# Patient Record
Sex: Female | Born: 1975 | Race: Black or African American | Hispanic: No | Marital: Married | Smoking: Never smoker
Health system: Southern US, Community
[De-identification: ages and names within clinical notes are randomized; demographics above are authoritative.]

## PROBLEM LIST (undated history)

## (undated) HISTORY — PX: BUNIONECTOMY: SHX129

---

## 2003-08-25 ENCOUNTER — Emergency Department (HOSPITAL_COMMUNITY): Admission: EM | Admit: 2003-08-25 | Discharge: 2003-08-26 | Payer: Self-pay | Admitting: Emergency Medicine

## 2005-02-05 ENCOUNTER — Other Ambulatory Visit: Admission: RE | Admit: 2005-02-05 | Discharge: 2005-02-05 | Payer: Self-pay | Admitting: *Deleted

## 2006-02-22 ENCOUNTER — Other Ambulatory Visit: Admission: RE | Admit: 2006-02-22 | Discharge: 2006-02-22 | Payer: Self-pay | Admitting: *Deleted

## 2006-04-12 ENCOUNTER — Emergency Department (HOSPITAL_COMMUNITY): Admission: EM | Admit: 2006-04-12 | Discharge: 2006-04-12 | Payer: Self-pay | Admitting: Emergency Medicine

## 2007-05-10 ENCOUNTER — Other Ambulatory Visit: Admission: RE | Admit: 2007-05-10 | Discharge: 2007-05-10 | Payer: Self-pay | Admitting: *Deleted

## 2008-06-21 ENCOUNTER — Other Ambulatory Visit: Admission: RE | Admit: 2008-06-21 | Discharge: 2008-06-21 | Payer: Self-pay | Admitting: Gynecology

## 2012-07-19 ENCOUNTER — Encounter (HOSPITAL_COMMUNITY): Payer: Self-pay | Admitting: Emergency Medicine

## 2012-07-19 ENCOUNTER — Emergency Department (INDEPENDENT_AMBULATORY_CARE_PROVIDER_SITE_OTHER)
Admission: EM | Admit: 2012-07-19 | Discharge: 2012-07-19 | Disposition: A | Discharge status: Home / Self Care | Payer: Self-pay | Source: Home / Self Care | Attending: Family Medicine | Admitting: Family Medicine

## 2012-07-19 DIAGNOSIS — J020 Streptococcal pharyngitis: Secondary | ICD-10-CM

## 2012-07-19 LAB — POCT RAPID STREP A: Streptococcus, Group A Screen (Direct): NEGATIVE

## 2012-07-19 MED ORDER — AMOXICILLIN 500 MG PO CAPS: 500.0000 mg | ORAL_CAPSULE | Freq: Three times a day (TID) | ORAL | Status: DC

## 2012-07-19 NOTE — ED Provider Notes (Signed)
History     CSN: [id number]  Arrival date & time 07/19/12  1207   First MD Initiated Contact with Patient 07/19/12 1213      Chief Complaint  Patient presents with  . Sore Throat    (Consider location/radiation/quality/duration/timing/severity/associated sxs/prior treatment) Patient is a 36 y.o. female presenting with pharyngitis. The history is provided by the patient.  Sore Throat This is a new problem. The current episode started more than 2 days ago. The problem has been gradually worsening. The symptoms are aggravated by swallowing.    History reviewed. No pertinent past medical history.  Past Surgical History  Procedure Date  . Bunionectomy     No family history on file.  History  Substance Use Topics  . Smoking status: Never Smoker   . Smokeless tobacco: Not on file  . Alcohol Use: No    OB History    Grav Para Term Preterm Abortions TAB SAB Ect Mult Living                  Review of Systems  Constitutional: Positive for fever, chills and appetite change.  HENT: Positive for sore throat.   Hematological: Positive for adenopathy.    Allergies  Review of patient's allergies indicates no known allergies.  Home Medications   Current Outpatient Rx  Name  Route  Sig  Dispense  Refill  . NAPROXEN SODIUM 220 MG PO TABS   Oral   Take 220 mg by mouth 2 (two) times daily with a meal.         . OSELTAMIVIR PHOSPHATE 75 MG PO CAPS   Oral   Take 75 mg by mouth.         . AMOXICILLIN 500 MG PO CAPS   Oral   Take 1 capsule (500 mg total) by mouth 3 (three) times daily.   30 capsule   0     BP 103/72  Pulse 89  Temp 99.9 F (37.7 C) (Oral)  Resp 20  SpO2 98%  LMP 06/29/2012  Physical Exam  Nursing note and vitals reviewed. Constitutional: She is oriented to person, place, and time. She appears well-developed and well-nourished.  HENT:  Head: Normocephalic.  Right Ear: External ear normal.  Left Ear: External ear normal.  Mouth/Throat:  Oropharyngeal exudate present.  Eyes: Conjunctivae normal are normal. Pupils are equal, round, and reactive to light.  Neck: Normal range of motion. Neck supple.  Lymphadenopathy:    She has cervical adenopathy.  Neurological: She is alert and oriented to person, place, and time.  Skin: Skin is warm and dry. No rash noted.    ED Course  Procedures (including critical care time)   Labs Reviewed  POCT RAPID STREP A ([facility] CARE ONLY)   No results found.   1. Strep sore throat       MDM          Linna Hoff, MD 07/19/12 1340

## 2012-07-19 NOTE — ED Notes (Signed)
Sore throat, fever, general aches, cough, and headache.  Onset November 1.

## 2013-05-08 LAB — LAB REPORT - SCANNED: Pap: NEGATIVE

## 2015-02-18 ENCOUNTER — Ambulatory Visit (INDEPENDENT_AMBULATORY_CARE_PROVIDER_SITE_OTHER): Payer: BLUE CROSS/BLUE SHIELD

## 2015-02-18 ENCOUNTER — Ambulatory Visit (INDEPENDENT_AMBULATORY_CARE_PROVIDER_SITE_OTHER): Payer: BLUE CROSS/BLUE SHIELD | Admitting: Podiatry

## 2015-02-18 ENCOUNTER — Encounter: Payer: Self-pay | Admitting: Podiatry

## 2015-02-18 VITALS — BP 117/76 | HR 76 | Resp 12

## 2015-02-18 DIAGNOSIS — M779 Enthesopathy, unspecified: Secondary | ICD-10-CM | POA: Diagnosis not present

## 2015-02-18 DIAGNOSIS — M2010 Hallux valgus (acquired), unspecified foot: Secondary | ICD-10-CM

## 2015-02-18 NOTE — Progress Notes (Signed)
   Subjective:    Patient ID: Heidi Mullins, female    DOB: 1976/03/21, 39 y.o.   MRN: [id number]  HPI  PT STATED HAVE BUNIONECTOMY DONE 15 YEARS AGO AND RT FOOT PAIN IS GETTING WORSE THAN LT FOOT FOR 1 YEAR. FEET ARE GETTING WORSE ESPECIALLY WHEN WEARING SHOES OR SITTING. TRIED NO TREATMENT  Review of Systems  Musculoskeletal: Positive for back pain.  Neurological: Positive for headaches.       Objective:   Physical Exam        Assessment & Plan:

## 2015-02-18 NOTE — Patient Instructions (Signed)
Pre-Operative Instructions  Congratulations, you have decided to take an important step to improving your quality of life.  You can be assured that the doctors of [facility] will be with you every step of the way.  1. Plan to be at the surgery center/hospital at least 1 (one) hour prior to your scheduled time unless otherwise directed by the surgical center/hospital staff.  You must have a responsible adult accompany you, remain during the surgery and drive you home.  Make sure you have directions to the surgical center/hospital and know how to get there on time. 2. For hospital based surgery you will need to obtain a history and physical form from your family physician within 1 month prior to the date of surgery- we will give you a form for you primary physician.  3. We make every effort to accommodate the date you request for surgery.  There are however, times where surgery dates or times have to be moved.  We will contact you as soon as possible if a change in schedule is required.   4. No Aspirin/Ibuprofen for one week before surgery.  If you are on aspirin, any non-steroidal anti-inflammatory medications (Mobic, Aleve, Ibuprofen) you should stop taking it 7 days prior to your surgery.  You make take Tylenol  For pain prior to surgery.  5. Medications- If you are taking daily heart and blood pressure medications, seizure, reflux, allergy, asthma, anxiety, pain or diabetes medications, make sure the surgery center/hospital is aware before the day of surgery so they may notify you which medications to take or avoid the day of surgery. 6. No food or drink after midnight the night before surgery unless directed otherwise by surgical center/hospital staff. 7. No alcoholic beverages 24 hours prior to surgery.  No smoking 24 hours prior to or 24 hours after surgery. 8. Wear loose pants or shorts- loose enough to fit over bandages, boots, and casts. 9. No slip on shoes, sneakers are best. 10. Bring  your boot with you to the surgery center/hospital.  Also bring crutches or a walker if your physician has prescribed it for you.  If you do not have this equipment, it will be provided for you after surgery. 11. If you have not been contracted by the surgery center/hospital by the day before your surgery, call to confirm the date and time of your surgery. 12. Leave-time from work may vary depending on the type of surgery you have.  Appropriate arrangements should be made prior to surgery with your employer. 13. Prescriptions will be provided immediately following surgery by your doctor.  Have these filled as soon as possible after surgery and take the medication as directed. 14. Remove nail polish on the operative foot. 15. Wash the night before surgery.  The night before surgery wash the foot and leg well with the antibacterial soap provided and water paying special attention to beneath the toenails and in between the toes.  Rinse thoroughly with water and dry well with a towel.  Perform this wash unless told not to do so by your physician.  Enclosed: 1 Ice pack (please put in freezer the night before surgery)   1 Hibiclens skin cleaner   Pre-op Instructions  If you have any questions regarding the instructions, do not hesitate to call our office.  [place]: [address] , [place] [address] [phone]  [place]: [address] [place], [place] [address] [phone]  [place]: [address]  [place], [place] [address] [phone]  Dr. Richard   Tuchman DPM, Dr. Norman Regal DPM Dr. Richard Sikora DPM, Dr. M. Todd Hyatt DPM, Dr. Kathryn Egerton DPM 

## 2015-02-18 NOTE — Progress Notes (Signed)
Subjective:     Patient ID: Heidi Mullins, female   DOB: Jul 06, 1976, 39 y.o.   MRN: [id number]  HPI patient states this bunion on my right foot has really been bothering me and I had it fixed approximately 18 years ago and it's making it hard for me to wear shoes. Also my arches can get tired and the left one is not as bad but I wanted it checked to   Review of Systems  All other systems reviewed and are negative.      Objective:   Physical Exam  Constitutional: She is oriented to person, place, and time.  Cardiovascular: Intact distal pulses.   Musculoskeletal: Normal range of motion.  Neurological: She is oriented to person, place, and time.  Skin: Skin is warm.  Nursing note and vitals reviewed.  neurovascular status intact muscle strength adequate with range of motion subtalar midtarsal joint within normal limits. Patient's found have hyperostosis with redness medial aspect first metatarsal head right that is painful when pressed and also patient is noted to have surgical scars first metatarsal right and left  in line with ones that we have done previously. [place] and also complains of pain in the mid arch area bilateral with depression of the arch noted bilateral. 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 Depression of the arch with tendinitis    Plan:      H&P and x-rays reviewed with patient. At this point I recommended pin removal and distal osteotomy right first metatarsal is a do believe the deformity is moderate and we can move over a little bit more and give her relief of symptoms I explained this is revisional surgery and is no guarantee and she   understands stands this. I then went ahead and I scanned for custom orthotics to reduce stress against her arches and when she returns we will go over  consent form for surgery scheduled in July

## 2015-03-11 ENCOUNTER — Ambulatory Visit (INDEPENDENT_AMBULATORY_CARE_PROVIDER_SITE_OTHER): Payer: BLUE CROSS/BLUE SHIELD | Admitting: Podiatry

## 2015-03-11 ENCOUNTER — Ambulatory Visit: Payer: BLUE CROSS/BLUE SHIELD | Admitting: Podiatry

## 2015-03-11 DIAGNOSIS — M2010 Hallux valgus (acquired), unspecified foot: Secondary | ICD-10-CM | POA: Diagnosis not present

## 2015-03-11 DIAGNOSIS — Z472 Encounter for removal of internal fixation device: Secondary | ICD-10-CM

## 2015-03-11 NOTE — Patient Instructions (Signed)
Pre-Operative Instructions  Congratulations, you have decided to take an important step to improving your quality of life.  You can be assured that the doctors of [facility] will be with you every step of the way.  1. Plan to be at the surgery center/hospital at least 1 (one) hour prior to your scheduled time unless otherwise directed by the surgical center/hospital staff.  You must have a responsible adult accompany you, remain during the surgery and drive you home.  Make sure you have directions to the surgical center/hospital and know how to get there on time. 2. For hospital based surgery you will need to obtain a history and physical form from your family physician within 1 month prior to the date of surgery- we will give you a form for you primary physician.  3. We make every effort to accommodate the date you request for surgery.  There are however, times where surgery dates or times have to be moved.  We will contact you as soon as possible if a change in schedule is required.   4. No Aspirin/Ibuprofen for one week before surgery.  If you are on aspirin, any non-steroidal anti-inflammatory medications (Mobic, Aleve, Ibuprofen) you should stop taking it 7 days prior to your surgery.  You make take Tylenol  For pain prior to surgery.  5. Medications- If you are taking daily heart and blood pressure medications, seizure, reflux, allergy, asthma, anxiety, pain or diabetes medications, make sure the surgery center/hospital is aware before the day of surgery so they may notify you which medications to take or avoid the day of surgery. 6. No food or drink after midnight the night before surgery unless directed otherwise by surgical center/hospital staff. 7. No alcoholic beverages 24 hours prior to surgery.  No smoking 24 hours prior to or 24 hours after surgery. 8. Wear loose pants or shorts- loose enough to fit over bandages, boots, and casts. 9. No slip on shoes, sneakers are best. 10. Bring  your boot with you to the surgery center/hospital.  Also bring crutches or a walker if your physician has prescribed it for you.  If you do not have this equipment, it will be provided for you after surgery. 11. If you have not been contracted by the surgery center/hospital by the day before your surgery, call to confirm the date and time of your surgery. 12. Leave-time from work may vary depending on the type of surgery you have.  Appropriate arrangements should be made prior to surgery with your employer. 13. Prescriptions will be provided immediately following surgery by your doctor.  Have these filled as soon as possible after surgery and take the medication as directed. 14. Remove nail polish on the operative foot. 15. Wash the night before surgery.  The night before surgery wash the foot and leg well with the antibacterial soap provided and water paying special attention to beneath the toenails and in between the toes.  Rinse thoroughly with water and dry well with a towel.  Perform this wash unless told not to do so by your physician.  Enclosed: 1 Ice pack (please put in freezer the night before surgery)   1 Hibiclens skin cleaner   Pre-op Instructions  If you have any questions regarding the instructions, do not hesitate to call our office.  [place]: [address] [place], [place] [address] [phone]  [place]: [address] [place], [place] [address] [phone]  [place]: [address]  [place], [place] [address] [phone]  Dr. Richard   Tuchman DPM, Dr. Norman Regal DPM Dr. Richard Sikora DPM, Dr. M. Todd Hyatt DPM, Dr. Kathryn Egerton DPM 

## 2015-03-13 ENCOUNTER — Telehealth: Payer: Self-pay | Admitting: *Deleted

## 2015-03-13 NOTE — Progress Notes (Signed)
Subjective:     Patient ID: Heidi Mullins, female   DOB: [birth year], 39 y.o.   MRN: [id number]  HPI patient presents stating I'm excited to get my bunion fixed and it still bothers me quite a bit when I'm in shoe gear   Review of Systems     Objective:   Physical Exam Neurovascular status intact muscle strength adequate range of motion within normal limits. Patient's noted to have hyperostosis medial aspect first metatarsal head right and deviation of the big toe against the second toe with a mild prominence of the previous pin position. 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 Patient would like to undergo this and wants correction and after extensive review consent form reviewing alternative treatments and complications patient is scheduled for outpatient surgery. Patient will have this done and will be seen in the surgical center in the next several weeks and was encouraged to call with questions and I did dispense air fracture walker with instructions on usage today

## 2015-03-14 NOTE — Telephone Encounter (Addendum)
"  I'm calling in regards to an upcoming surgery on the 12th of July.  I was hoping to get an estimate of what I might expect my expenses to be.  Thank you so much."  Per Heidi Mullins, patient stated she has called several times and left messages.  She is needing an estimated cost of surgery before she takes time off from work.  If it's too expensive she may not do the surgery.  Please call.  I left patient a message that I was returning her call.  I apologized for not returning call yesterday.  I was out of the office.  I was made aware that you said you had left several messages and had not received a call back.  I'm not sure where messages may have been left but I do apologize for any inconvenience this may have caused.  Please give me a call back.  I need to inform patient that her estimate is about $1065.

## 2015-03-21 NOTE — Telephone Encounter (Signed)
I attempted to call patient.  I left her a message to call me back. 

## 2015-03-26 ENCOUNTER — Encounter: Payer: Self-pay | Admitting: Podiatry

## 2015-03-26 DIAGNOSIS — Z4889 Encounter for other specified surgical aftercare: Secondary | ICD-10-CM | POA: Diagnosis not present

## 2015-03-26 DIAGNOSIS — M2011 Hallux valgus (acquired), right foot: Secondary | ICD-10-CM | POA: Diagnosis not present

## 2015-04-01 ENCOUNTER — Ambulatory Visit (INDEPENDENT_AMBULATORY_CARE_PROVIDER_SITE_OTHER): Payer: BLUE CROSS/BLUE SHIELD | Admitting: Podiatry

## 2015-04-01 ENCOUNTER — Encounter: Payer: Self-pay | Admitting: Podiatry

## 2015-04-01 ENCOUNTER — Ambulatory Visit: Payer: BLUE CROSS/BLUE SHIELD

## 2015-04-01 VITALS — BP 107/60 | HR 91 | Resp 15

## 2015-04-01 DIAGNOSIS — Z9889 Other specified postprocedural states: Secondary | ICD-10-CM

## 2015-04-01 DIAGNOSIS — M2011 Hallux valgus (acquired), right foot: Secondary | ICD-10-CM

## 2015-04-02 NOTE — Progress Notes (Signed)
Patient ID: Madelynn Donebony Hanks, female   DOB: Apr 18, 1976, 39 y.o.   MRN: [id number]  Subjective: 39 year old female presents the office today one-week status post right foot bunion correction with removal. She states that overall she is doing well and her pain is controlled. She denies any systemic complaints as fevers, chills, nausea, vomiting. Denies any calf pain, chest pain, shows of breath. She's remain in a Cam Walker. No other complaints in no acute changes.  Objective: AAO 3, NAD Dressings are clean, dry, intact. Upon removal DP/PT pulses are palpable. CRT is less than 3 seconds. Protective sensation appears to be intact. Incision is well coapted without any evidence of dehiscence and sutures are intact. There is minimal edema over surgical site. There is no swelling erythema, ascending cellulitis, fluctuance, crepitus, malodor, drainage. There is no signs of infection at this time. There is mild tenderness palpation along the surgical site. There is no pain with first MTPJ range of motion and range of motion is intact. No other areas of tenderness to bilateral lower extremity. No other open lesions or pre-ulcerative lesions. No pain with calf compression, swelling, warmth, erythema.  Assessment: 39 year old female 1 week status post bunionectomy  Plan: -X-rays were obtained and reviewed with the patient.  -Treatment options discussed including all alternatives, risks, and complications -Antibiotic when it was placed over the incision followed by dry sterile dressing. Keep dressing clean, dry, intact. -Ice and elevation -Pain medications needed -Continue Cam Walker -Monitor for any clinical signs or symptoms of infection and directed to call the office immediately should any occur or go to the ER. -Follow-up 1 week for suture removal or sooner if any problems arise. In the meantime, encouraged to call the office with any questions, concerns, change in symptoms.   Ovid CurdMatthew Jedadiah Abdallah, DPM

## 2015-04-04 ENCOUNTER — Encounter: Payer: Self-pay | Admitting: Podiatry

## 2015-04-08 ENCOUNTER — Ambulatory Visit (INDEPENDENT_AMBULATORY_CARE_PROVIDER_SITE_OTHER): Payer: BLUE CROSS/BLUE SHIELD | Admitting: Podiatry

## 2015-04-08 ENCOUNTER — Encounter: Payer: Self-pay | Admitting: Podiatry

## 2015-04-08 VITALS — BP 102/69 | HR 86 | Resp 15

## 2015-04-08 DIAGNOSIS — M2011 Hallux valgus (acquired), right foot: Secondary | ICD-10-CM

## 2015-04-08 DIAGNOSIS — Z9889 Other specified postprocedural states: Secondary | ICD-10-CM

## 2015-04-08 NOTE — Progress Notes (Signed)
Patient ID: Savina Olshefski, female   DOB: 02-07-1976, 39 y.o.   MRN: [id number]   Subjective: 39 year old female presents the office today one-week status post right foot bunion correction with hardware removal with Dr. Charlsie Merles. She states that overall she is doing well and her pain is controlled. She denies any systemic complaints as fevers, chills, nausea, vomiting. Denies any calf pain, chest pain, shows of breath. She's remain in a Cam Walker. No other complaints in no acute changes.  Objective: AAO 3, NAD Dressings are clean, dry, intact. Upon removal DP/PT pulses are palpable. CRT is less than 3 seconds.  Protective sensation appears to be intact.  Incision is well coapted without any evidence of dehiscence and sutures are intact. There is minimal edema over surgical site. There is no surrounding erythema, ascending cellulitis, fluctuance, crepitus, malodor, drainage. There are no signs of infection at this time. There is mild tenderness to palpation along the surgical site. There is no pain with first MTPJ range of motion and range of motion is intact. No other areas of tenderness to bilateral lower extremity.  No other open lesions or pre-ulcerative lesions. No pain with calf compression, swelling, warmth, erythema.  Assessment: 39 year old female 2 weeks status post bunionectomy  Plan: -Treatment options discussed including all alternatives, risks, and complications -Suture ends were cut. Antibiotic when it was placed over the incision followed by dry sterile dressing. Keep dressing clean, dry, intact. Can remove the dressing tomorrow and start to shower as long as the incision remains coapted. If there are any problems to hold off on showering and call the office immediatly.  -Ice and elevation -Pain medications needed -Continue Cam Walker -Monitor for any clinical signs or symptoms of infection and directed to call the office immediately should any occur or go to the ER. -Follow-up 2  weeks with Dr. Charlsie Merles or sooner if any problems arise. In the meantime, encouraged to call the office with any questions, concerns, change in symptoms.   Ovid Curd, DPM

## 2015-04-09 ENCOUNTER — Encounter: Payer: BLUE CROSS/BLUE SHIELD | Admitting: Podiatry

## 2015-04-17 ENCOUNTER — Encounter: Payer: BLUE CROSS/BLUE SHIELD | Admitting: Podiatry

## 2015-04-18 ENCOUNTER — Ambulatory Visit (INDEPENDENT_AMBULATORY_CARE_PROVIDER_SITE_OTHER): Payer: BLUE CROSS/BLUE SHIELD

## 2015-04-18 ENCOUNTER — Encounter: Payer: Self-pay | Admitting: Podiatry

## 2015-04-18 ENCOUNTER — Ambulatory Visit (INDEPENDENT_AMBULATORY_CARE_PROVIDER_SITE_OTHER): Payer: BLUE CROSS/BLUE SHIELD | Admitting: Podiatry

## 2015-04-18 VITALS — BP 100/67 | HR 89 | Temp 99.5°F | Resp 15

## 2015-04-18 DIAGNOSIS — Z9889 Other specified postprocedural states: Secondary | ICD-10-CM

## 2015-04-18 DIAGNOSIS — M2011 Hallux valgus (acquired), right foot: Secondary | ICD-10-CM

## 2015-04-20 NOTE — Progress Notes (Signed)
Subjective:     Patient ID: Heidi Mullins, female   DOB: May 21, 1976, 39 y.o.   MRN: [id number]  HPI patient states I'm doing real well with my right foot and I'm able to walk distances without significant swelling or pain   Review of Systems     Objective:   Physical Exam Neurovascular status intact muscle strength adequate range of motion within normal limits. Patient is noted to have increased range of motion first MPJ with wound edges that are well coapted and good alignment noted with no crepitus of the joint    Assessment:     Doing well post osteotomy first metatarsal with removal of pin    Plan:     X-rays reviewed and explained there still some healing to occur and to still be of careful with this condition and also the fact that there is a small bit of pain I was unable to get out plantar but it should cause no issues. Continue gradual increase in weightbearing gradual return soft shoes over the next 2 weeks and reappoint in 4 weeks to reevaluate her if any issues should occur

## 2015-05-15 ENCOUNTER — Ambulatory Visit (INDEPENDENT_AMBULATORY_CARE_PROVIDER_SITE_OTHER): Payer: BLUE CROSS/BLUE SHIELD

## 2015-05-15 ENCOUNTER — Encounter: Payer: Self-pay | Admitting: Podiatry

## 2015-05-15 ENCOUNTER — Ambulatory Visit (INDEPENDENT_AMBULATORY_CARE_PROVIDER_SITE_OTHER): Payer: BLUE CROSS/BLUE SHIELD | Admitting: Podiatry

## 2015-05-15 VITALS — BP 100/71 | HR 81 | Resp 16

## 2015-05-15 DIAGNOSIS — Z9889 Other specified postprocedural states: Secondary | ICD-10-CM

## 2015-05-15 NOTE — Progress Notes (Signed)
Subjective:     Patient ID: Heidi Mullins, female   DOB: 05/08/1976, 39 y.o.   MRN: [id number]  HPI patient presents stating I'm doing pretty well with occasional swelling and discomfort if I been on my foot to much   Review of Systems     Objective:   Physical Exam Neurovascular status intact muscle strength adequate range of motion within normal limits with patient having well-healing surgical site right first metatarsal with wound edges well coapted and mild edema noted with no discomfort when I move the big toe    Assessment:     Doing well post surgery for removal of pin and structural bunion correction with reapplication of second pin    Plan:     Reviewed importance of range of motion exercises continued elevation compression and reviewed x-rays with patient. Reappoint 6 weeks or earlier if needed

## 2015-06-28 ENCOUNTER — Ambulatory Visit (INDEPENDENT_AMBULATORY_CARE_PROVIDER_SITE_OTHER): Payer: BLUE CROSS/BLUE SHIELD | Admitting: Podiatry

## 2015-06-28 ENCOUNTER — Ambulatory Visit (INDEPENDENT_AMBULATORY_CARE_PROVIDER_SITE_OTHER): Payer: BLUE CROSS/BLUE SHIELD

## 2015-06-28 VITALS — BP 127/77 | HR 72 | Resp 16

## 2015-06-28 DIAGNOSIS — Z9889 Other specified postprocedural states: Secondary | ICD-10-CM

## 2015-06-28 DIAGNOSIS — M2011 Hallux valgus (acquired), right foot: Secondary | ICD-10-CM | POA: Diagnosis not present

## 2015-06-30 NOTE — Progress Notes (Signed)
Subjective:     Patient ID: Heidi Mullins, female   DOB: 09-Jul-1976, 39 y.o.   MRN: [id number]  HPI patient states my foot is doing well with minimal discomfort and swelling and occasional pain if I walk too much   Review of Systems     Objective:   Physical Exam Neurovascular status intact muscle strength adequate range of motion within normal limits with good first MPJ range of motion and wound edges that are well coapted    Assessment:     Well-healing surgical site right foot    Plan:     Reviewed x-rays advised patient on range of motion gradual increase in activity and continued compression elevation. Reappoint to recheck again in approximately 6-8 weeks as needed

## 2019-12-04 ENCOUNTER — Other Ambulatory Visit: Payer: Self-pay | Admitting: Obstetrics and Gynecology

## 2019-12-04 DIAGNOSIS — R921 Mammographic calcification found on diagnostic imaging of breast: Secondary | ICD-10-CM

## 2019-12-15 ENCOUNTER — Ambulatory Visit: Payer: 59 | Attending: Internal Medicine

## 2019-12-15 DIAGNOSIS — Z23 Encounter for immunization: Secondary | ICD-10-CM

## 2019-12-15 NOTE — Progress Notes (Signed)
   Covid-19 Vaccination Clinic  Name:  Heidi Mullins    MRN: [id number] DOB: [birth year]  12/15/2019  [name] was observed post Covid-19 immunization for 15 minutes without incident. She was provided with Vaccine Information Sheet and instruction to access the V-Safe system.   [name] was instructed to call 911 with any severe reactions post vaccine: Marland Kitchen Difficulty breathing  . Swelling of face and throat  . A fast heartbeat  . A bad rash all over body  . Dizziness and weakness   Immunizations Administered    Name Date Dose VIS Date Route   Pfizer COVID-19 Vaccine 12/15/2019 10:11 AM 0.3 mL 08/25/2019 Intramuscular   Manufacturer: [employer], [employer]   Lot: [id number]   NDC: [address]-7

## 2020-01-04 ENCOUNTER — Ambulatory Visit
Admission: RE | Admit: 2020-01-04 | Discharge: 2020-01-04 | Disposition: A | Discharge status: Home / Self Care | Payer: 59 | Source: Ambulatory Visit | Attending: Obstetrics and Gynecology | Admitting: Obstetrics and Gynecology

## 2020-01-04 ENCOUNTER — Other Ambulatory Visit: Payer: Self-pay

## 2020-01-04 ENCOUNTER — Other Ambulatory Visit: Payer: Self-pay | Admitting: Obstetrics and Gynecology

## 2020-01-04 DIAGNOSIS — R921 Mammographic calcification found on diagnostic imaging of breast: Secondary | ICD-10-CM

## 2020-01-08 ENCOUNTER — Ambulatory Visit: Payer: 59 | Attending: Internal Medicine

## 2020-01-08 DIAGNOSIS — Z23 Encounter for immunization: Secondary | ICD-10-CM

## 2020-01-08 NOTE — Progress Notes (Signed)
   Covid-19 Vaccination Clinic  Name:  Kendallyn Lippold    MRN: [id number] DOB: 1976-03-31  01/08/2020  Ms. Cousar was observed post Covid-19 immunization for 15 minutes without incident. She was provided with Vaccine Information Sheet and instruction to access the V-Safe system.   Ms. Shropshire was instructed to call 911 with any severe reactions post vaccine: Marland Kitchen Difficulty breathing  . Swelling of face and throat  . A fast heartbeat  . A bad rash all over body  . Dizziness and weakness   Immunizations Administered    Name Date Dose VIS Date Route   Pfizer COVID-19 Vaccine 01/08/2020 12:42 PM 0.3 mL 11/08/2018 Intramuscular   Manufacturer: [employer], [employer]   Lot: [id number]   NDC: [address]-9

## 2020-05-03 ENCOUNTER — Other Ambulatory Visit: Payer: Self-pay

## 2020-05-03 ENCOUNTER — Other Ambulatory Visit: Payer: No Typology Code available for payment source

## 2020-05-03 DIAGNOSIS — Z20822 Contact with and (suspected) exposure to covid-19: Secondary | ICD-10-CM

## 2020-05-04 LAB — SPECIMEN STATUS REPORT

## 2020-05-04 LAB — NOVEL CORONAVIRUS, NAA: SARS-CoV-2, NAA: NOT DETECTED

## 2020-05-04 LAB — SARS-COV-2, NAA 2 DAY TAT

## 2020-07-08 ENCOUNTER — Other Ambulatory Visit: Payer: Self-pay

## 2020-07-08 ENCOUNTER — Ambulatory Visit
Admission: RE | Admit: 2020-07-08 | Discharge: 2020-07-08 | Disposition: A | Discharge status: Home / Self Care | Payer: No Typology Code available for payment source | Source: Ambulatory Visit | Attending: Obstetrics and Gynecology | Admitting: Obstetrics and Gynecology

## 2020-07-08 DIAGNOSIS — R921 Mammographic calcification found on diagnostic imaging of breast: Secondary | ICD-10-CM

## 2021-10-12 IMAGING — MG MM DIGITAL DIAGNOSTIC UNILAT*L* W/ TOMO W/ CAD
9 series · 9 of 17 positions shown · non-contrast
Comparison: Previous exam(s).

CLINICAL DATA: 44-year-old female for six-month follow-up of LEFT
breast calcifications. Stereotactic biopsy 6 months ago was canceled
as these calcifications were noted to layer at time of biopsy.

EXAM:
DIGITAL DIAGNOSTIC UNILATERAL LEFT MAMMOGRAM WITH TOMO AND CAD

[L CC (1 of 2)]
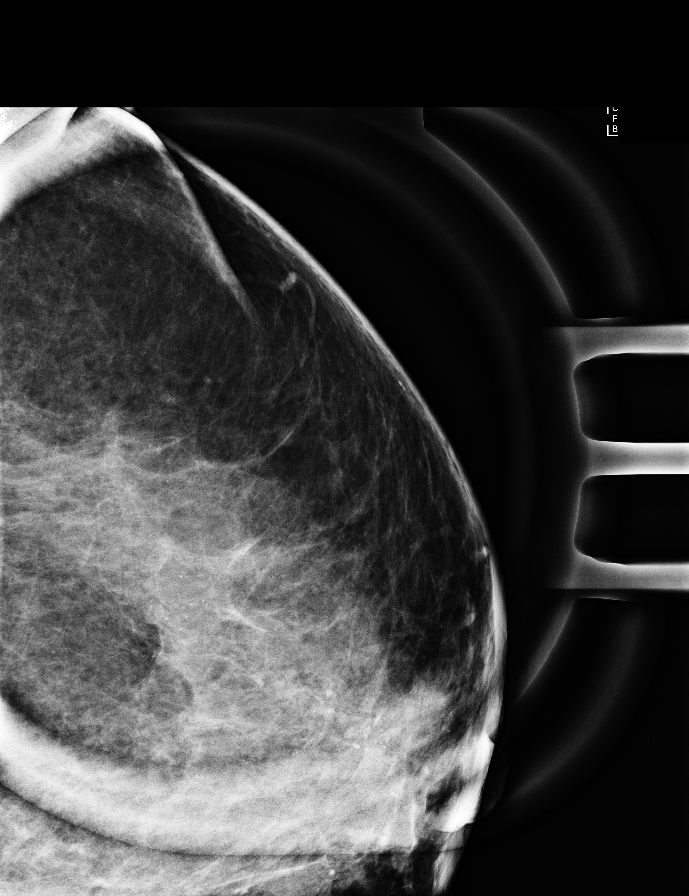

[L ML (1 of 3)]
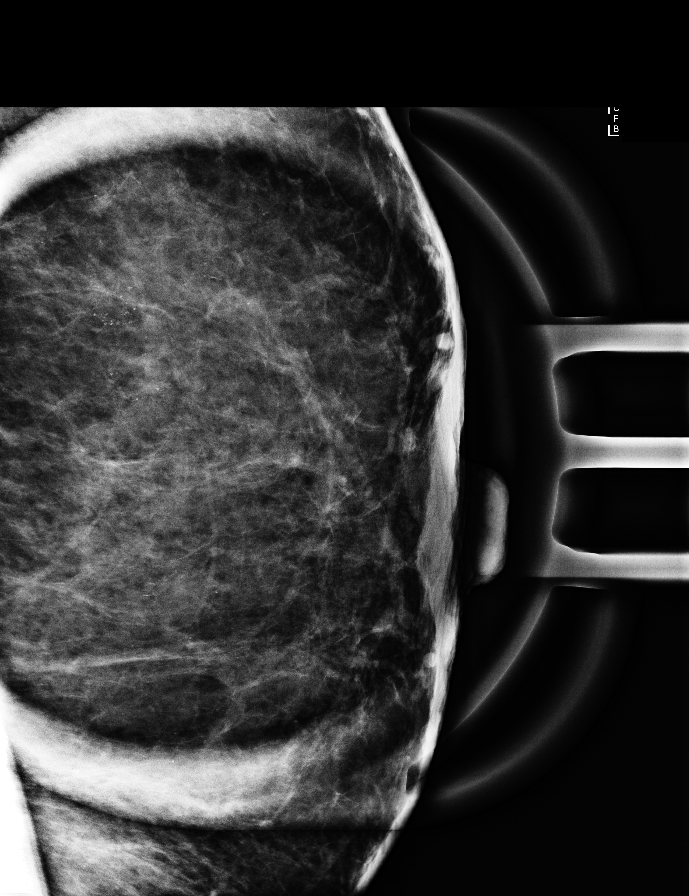

[L ML (2 of 3)]
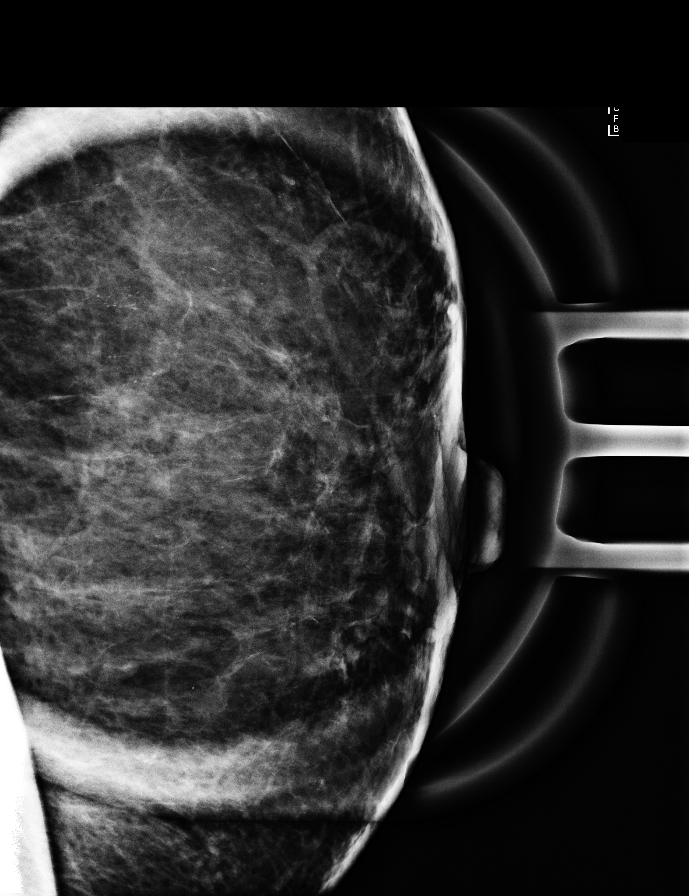

[L ML (3 of 3)]
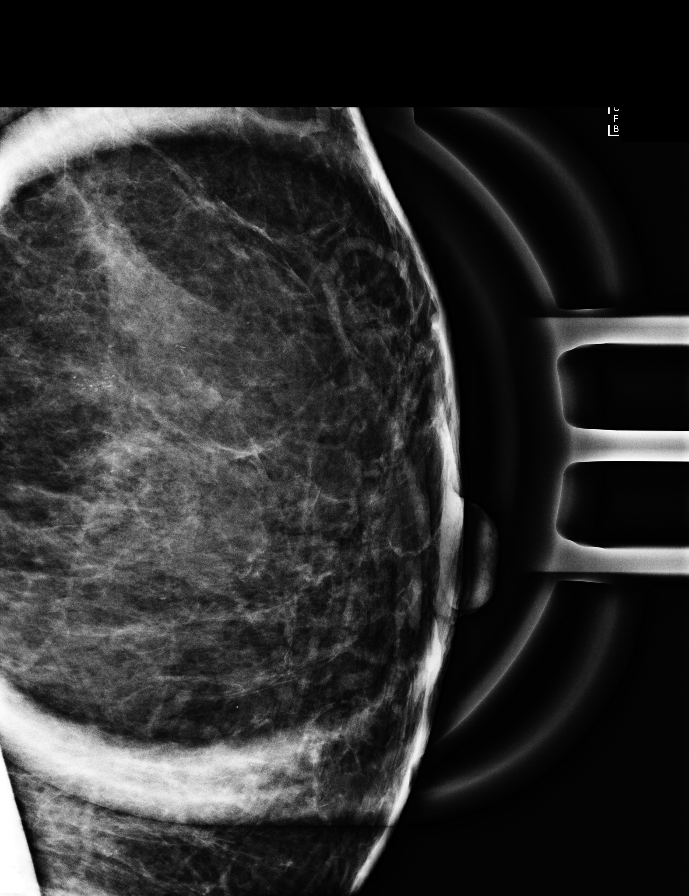

[L CC (2 of 2)]
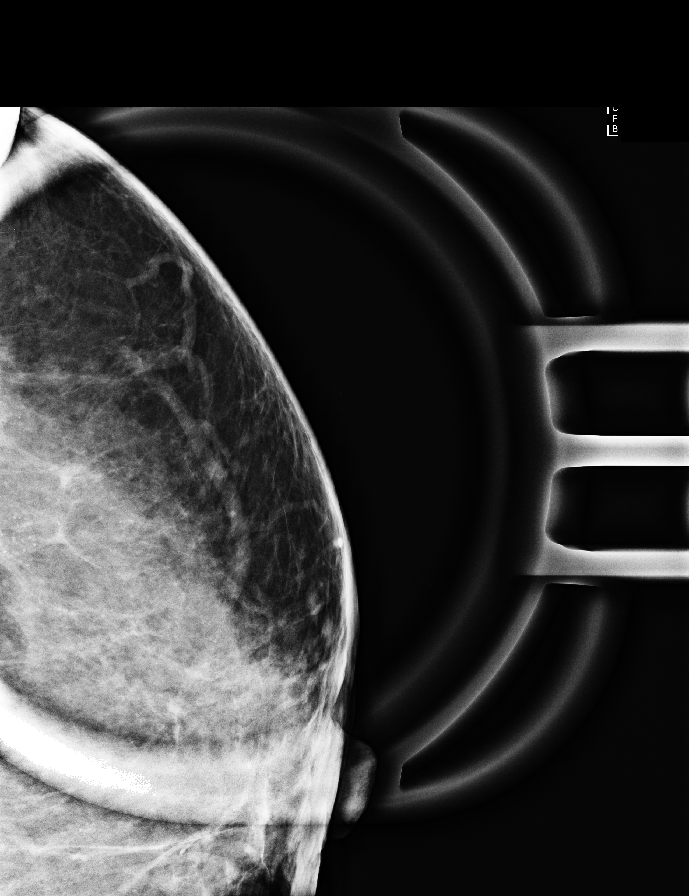

[L CC synth-2D]
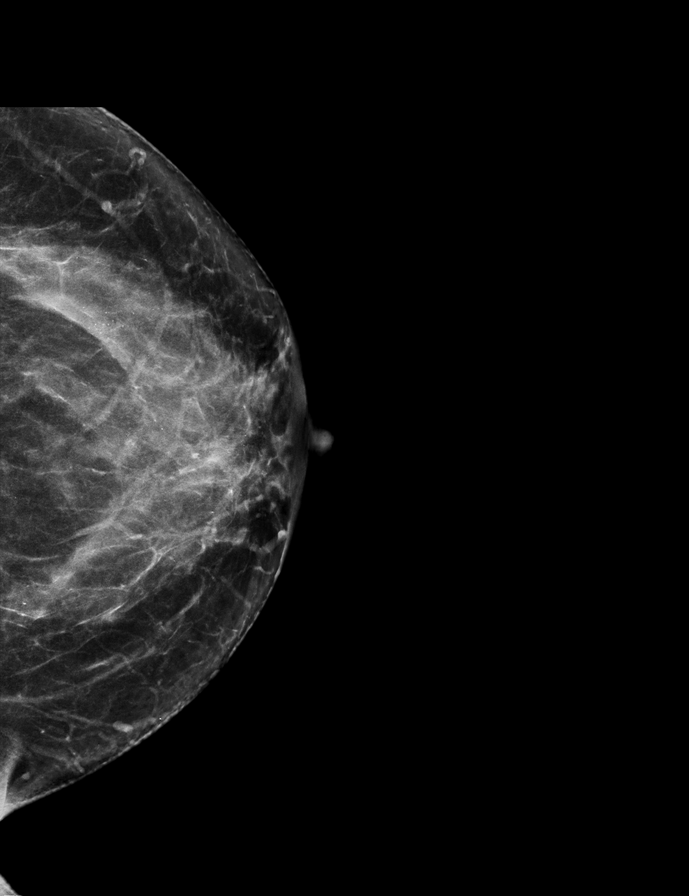

[L MLO synth-2D]
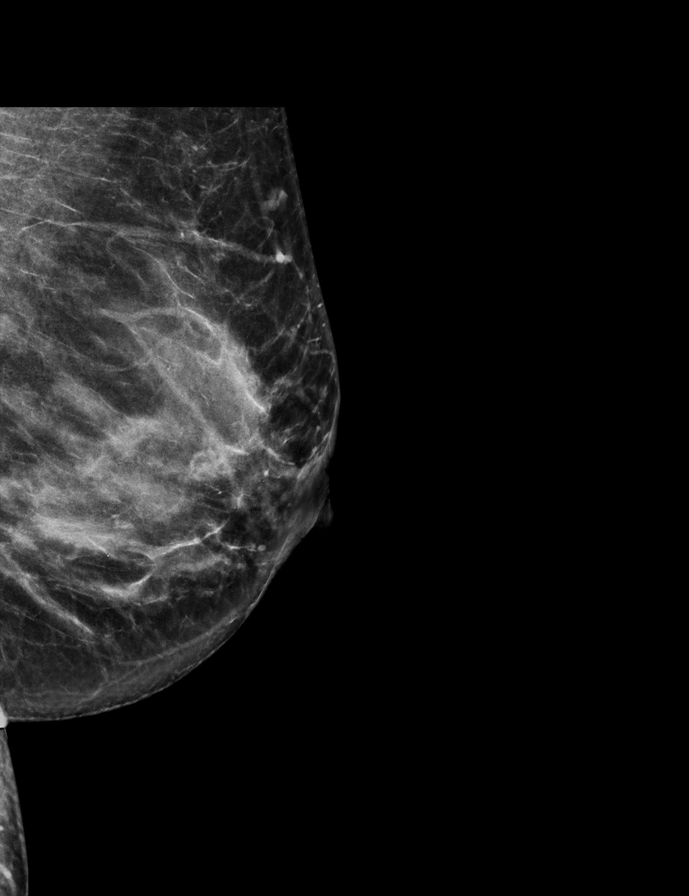

[L CC tomo · tomo slice 33/64.0]
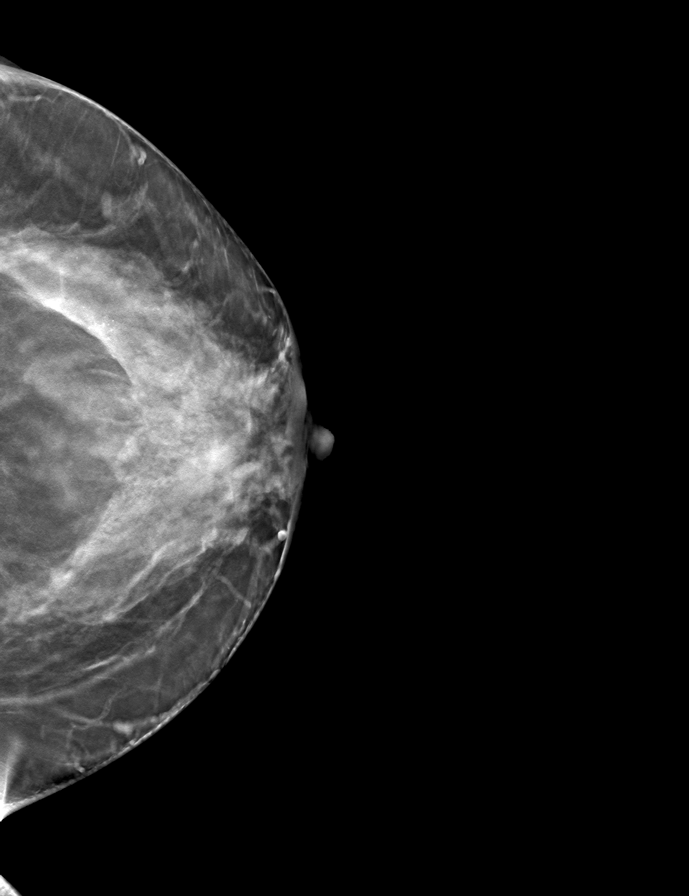

[L MLO tomo · tomo slice 35/68.0]
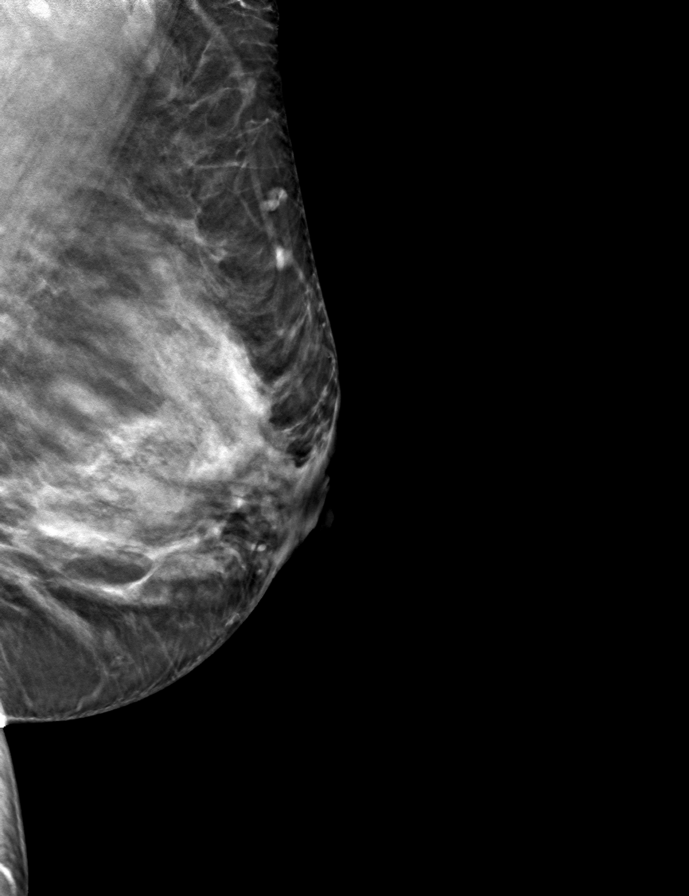

[9 of 17 positions shown; findings below may reference images not displayed]

ACR Breast Density Category c: The breast tissue is heterogeneously
dense, which may obscure small masses.
FINDINGS: 2D/3D full field views and magnification views of the LEFT breast
demonstrate an unchanged group of UPPER-OUTER LEFT breast
calcifications, which are hazy on the CC view and layer on the
LATERAL view, compatible with benign milk of calcium.

Mammographic images were processed with CAD.
IMPRESSION: 1. Benign milk of calcium within the UPPER-OUTER LEFT breast again
noted. No further imaging follow-up recommended.

RECOMMENDATION:
Bilateral screening mammogram in November 2020 to resume annual
mammogram schedule.

I have discussed the findings and recommendations with the patient.
If applicable, a reminder letter will be sent to the patient
regarding the next appointment.

BI-RADS CATEGORY  2: Benign.

## 2023-09-23 ENCOUNTER — Other Ambulatory Visit: Payer: Self-pay | Admitting: Obstetrics and Gynecology

## 2023-09-23 DIAGNOSIS — Z1231 Encounter for screening mammogram for malignant neoplasm of breast: Secondary | ICD-10-CM
